# Patient Record
Sex: Female | Born: 1980 | Race: Black or African American | Hispanic: No | Marital: Single | Smoking: Never smoker
Health system: Southern US, Community
[De-identification: ages and names within clinical notes are randomized; demographics above are authoritative.]

---
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
## 2003-09-30 ENCOUNTER — Emergency Department (HOSPITAL_COMMUNITY): Admission: EM | Admit: 2003-09-30 | Discharge: 2003-10-01 | Payer: Self-pay | Admitting: Emergency Medicine

## 2003-10-25 ENCOUNTER — Other Ambulatory Visit: Admission: RE | Admit: 2003-10-25 | Discharge: 2003-10-25 | Payer: Self-pay | Admitting: Obstetrics and Gynecology

## 2004-01-22 ENCOUNTER — Emergency Department (HOSPITAL_COMMUNITY): Admission: EM | Admit: 2004-01-22 | Discharge: 2004-01-22 | Payer: Self-pay | Admitting: Emergency Medicine

## 2004-01-25 ENCOUNTER — Ambulatory Visit (HOSPITAL_COMMUNITY): Admission: RE | Admit: 2004-01-25 | Discharge: 2004-01-25 | Payer: Self-pay | Admitting: Obstetrics & Gynecology

## 2004-02-29 ENCOUNTER — Ambulatory Visit (HOSPITAL_COMMUNITY): Admission: RE | Admit: 2004-02-29 | Discharge: 2004-02-29 | Payer: Self-pay | Admitting: Obstetrics & Gynecology

## 2004-03-06 ENCOUNTER — Inpatient Hospital Stay (HOSPITAL_COMMUNITY): Admission: AD | Admit: 2004-03-06 | Discharge: 2004-03-08 | Payer: Self-pay | Admitting: *Deleted

## 2004-03-06 ENCOUNTER — Encounter (INDEPENDENT_AMBULATORY_CARE_PROVIDER_SITE_OTHER): Payer: Self-pay | Admitting: *Deleted

## 2004-03-06 ENCOUNTER — Ambulatory Visit: Payer: Self-pay | Admitting: Neonatology

## 2005-07-08 ENCOUNTER — Inpatient Hospital Stay (HOSPITAL_COMMUNITY): Admission: AD | Admit: 2005-07-08 | Discharge: 2005-07-08 | Payer: Self-pay | Admitting: Obstetrics & Gynecology

## 2005-09-15 ENCOUNTER — Inpatient Hospital Stay (HOSPITAL_COMMUNITY): Admission: AD | Admit: 2005-09-15 | Discharge: 2005-09-15 | Payer: Self-pay | Admitting: Obstetrics & Gynecology

## 2005-09-18 ENCOUNTER — Inpatient Hospital Stay (HOSPITAL_COMMUNITY): Admission: AD | Admit: 2005-09-18 | Discharge: 2005-09-20 | Payer: Self-pay | Admitting: Obstetrics & Gynecology

## 2006-12-30 IMAGING — US US OB COMP +14 WK
1 series · 13 of 28 positions shown · non-contrast
Comparison: none

CLINICAL DATA: 30 week 1 day assigned gestational age. Pelvic pressure and possible preterm labor.  Question shortened cervix on physical exam.  Evaluate growth, estimated fetal weight, and cervix.

[Series 1: us ob comp +14 wk · 0.37mm/px · 13 of 44 slices shown]
[im 2/44]
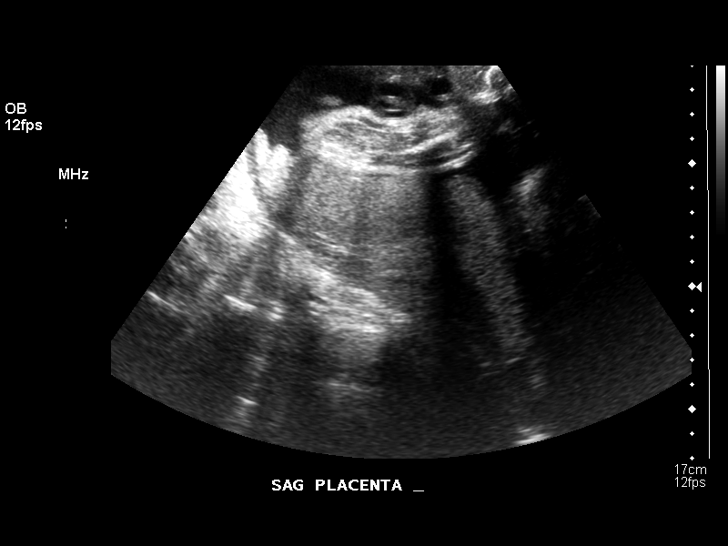
[im 5/44]
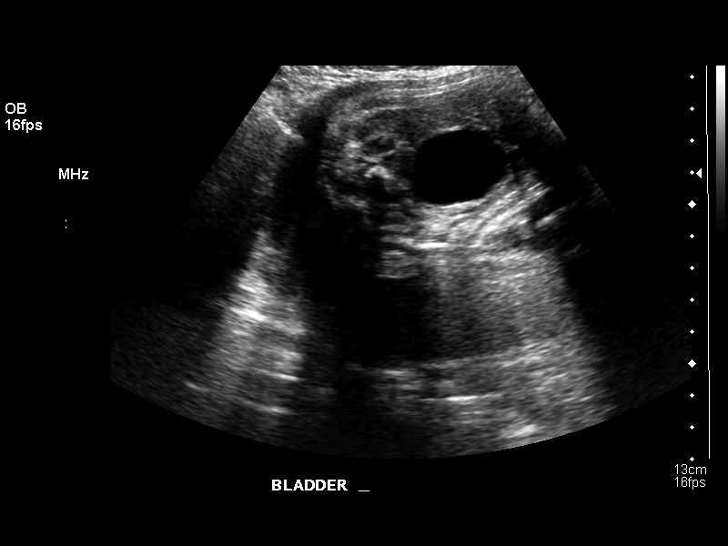
[im 8/44]
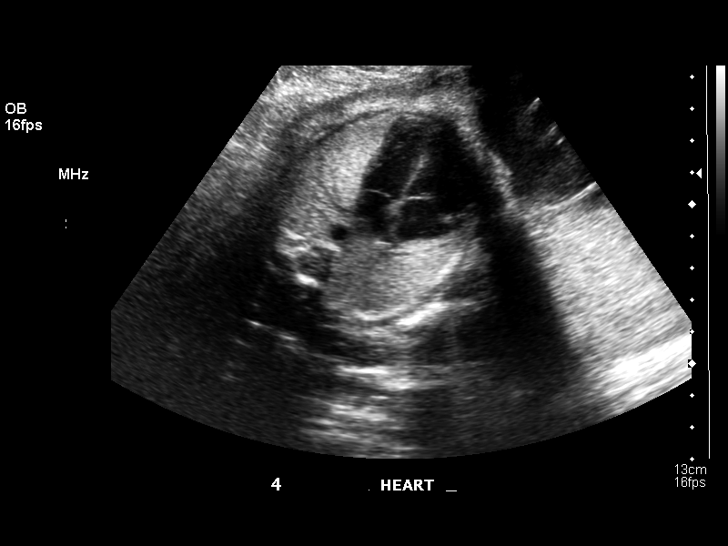
[im 12/44]
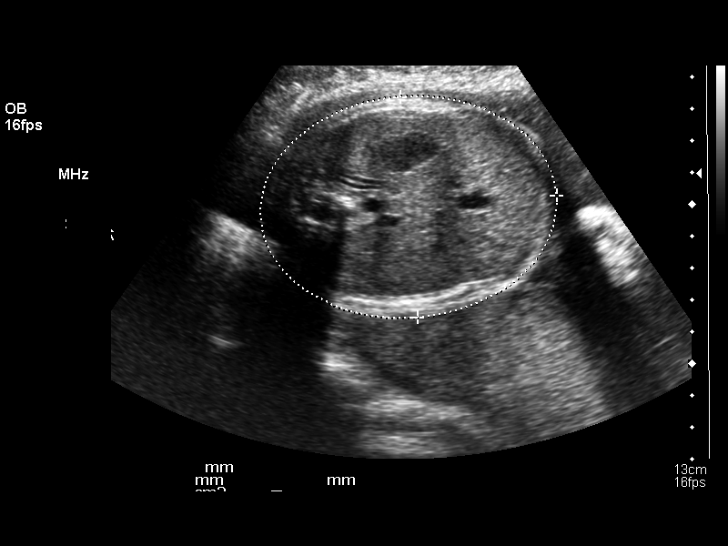
[im 15/44]
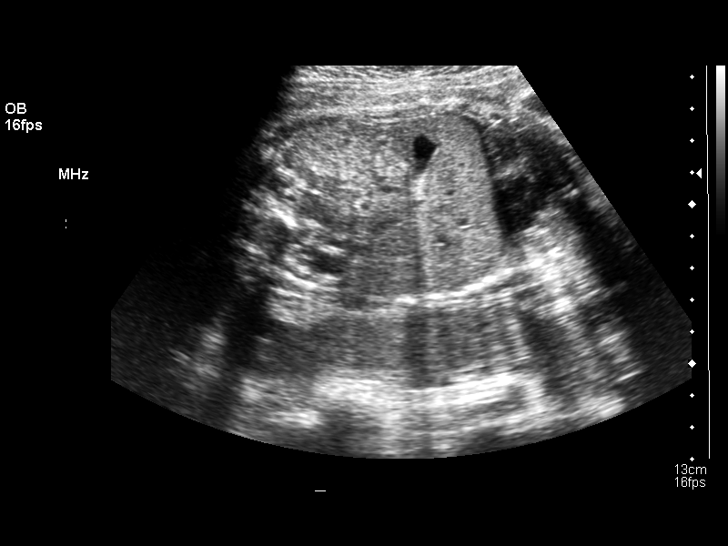
[im 18/44]
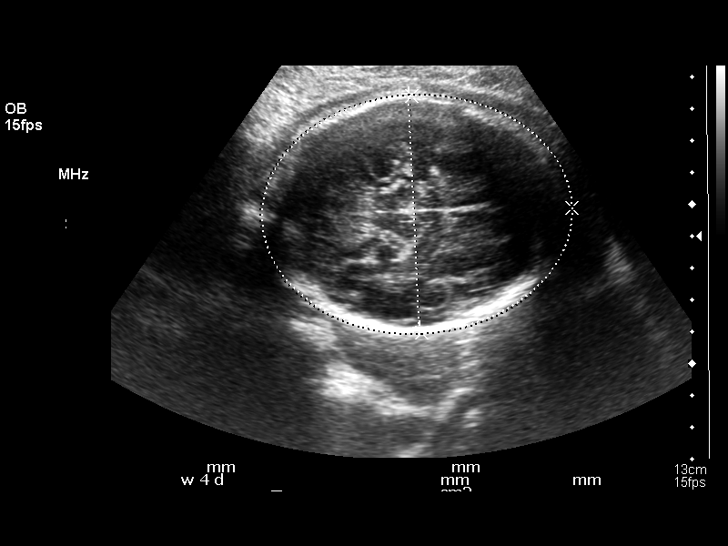
[im 23/44]
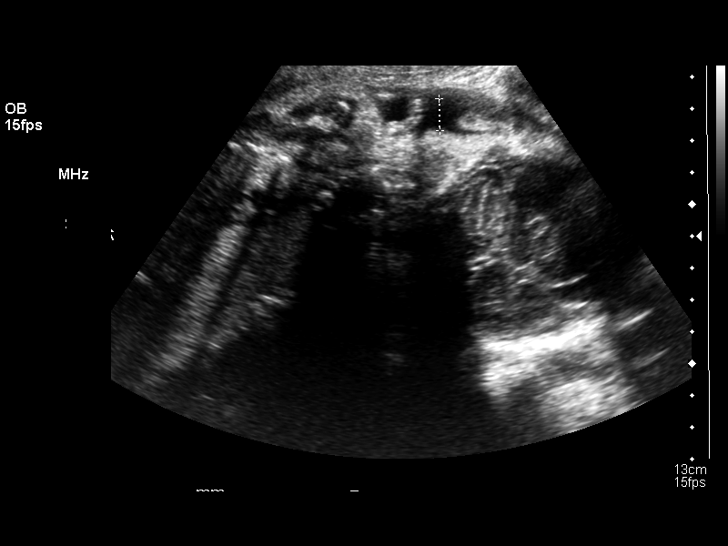
[im 26/44]
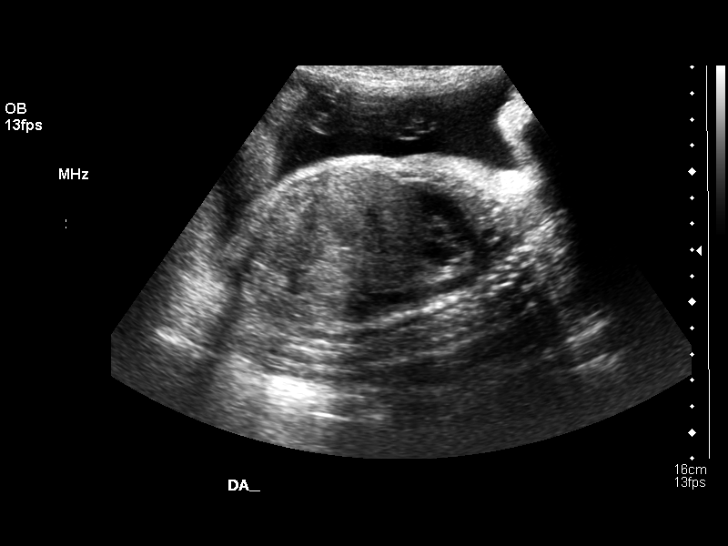
[im 29/44]
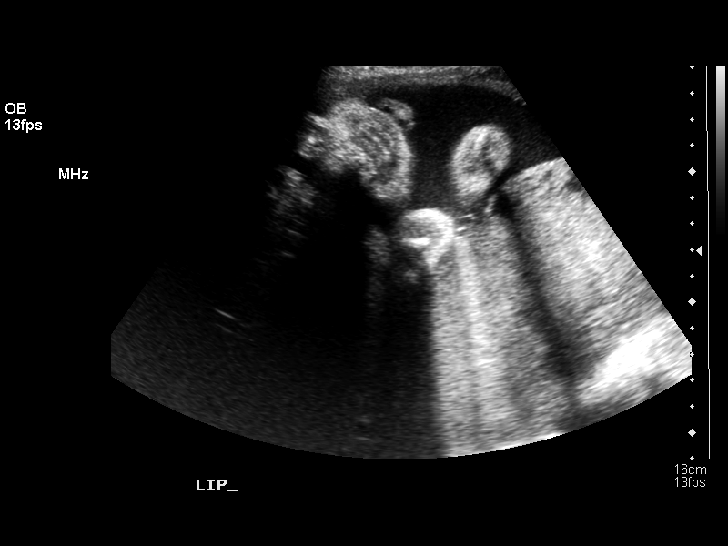
[im 32/44]
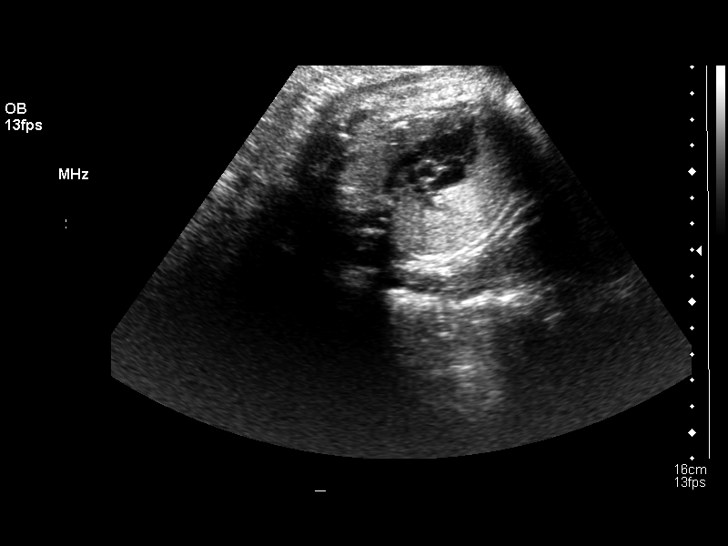
[im 36/44]
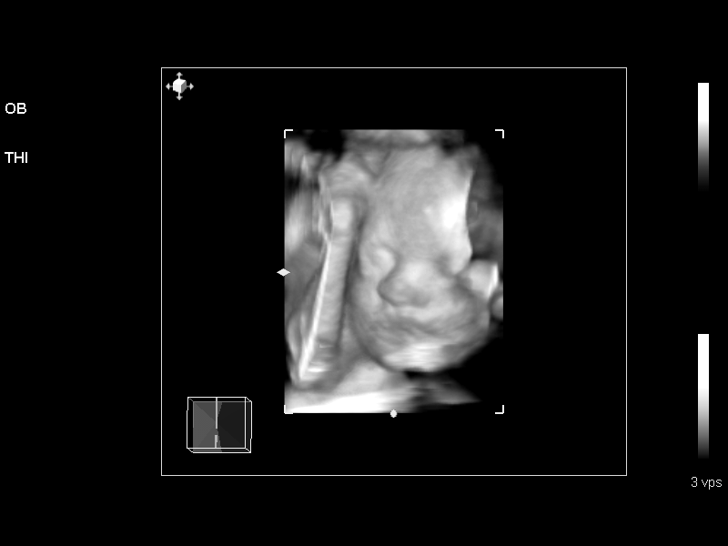
[im 39/44]
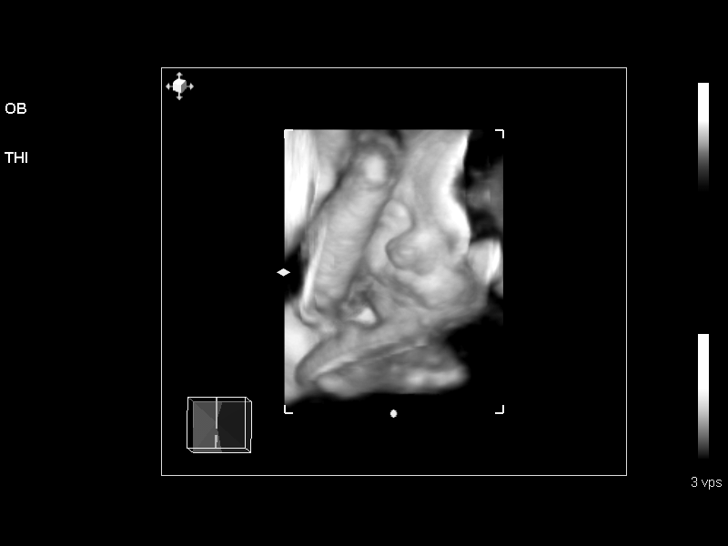
[im 42/44]
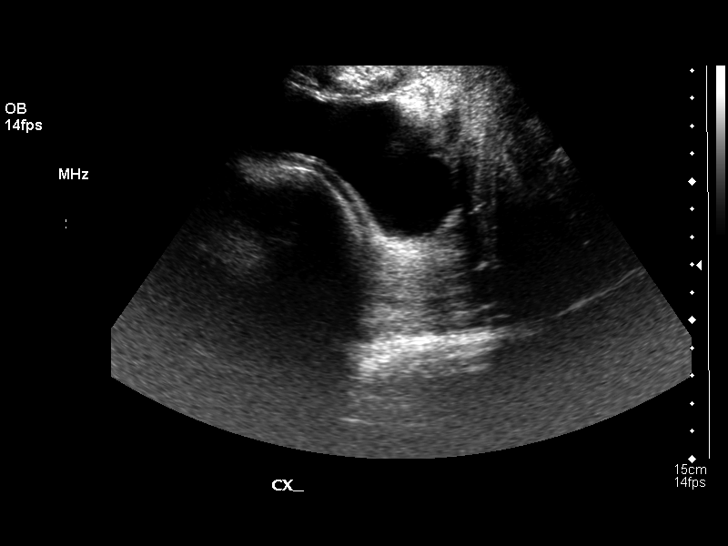

[13 of 28 positions shown; findings below may reference images not displayed]

OBSTETRICAL ULTRASOUND:
 Number of Fetuses: 1
 Heart Rate:  144
 Movement:  Yes
 Breathing:  No  
 Presentation:  Cephalic
 Placental Location:  Posterior
 Grade:  I
 Previa:  No
 Amniotic Fluid (Subjective):  Normal
 Amniotic Fluid (Objective):   13.3 cm AFI (5th -95th%ile = 9.0 ? 23.4 cm for 30 wks)

 FETAL BIOMETRY
 BPD:   7.4 cm   29 w 6 d
 HC:   27.1 cm   29 w 4 d
 AC:   25.5 cm   29 w 5 d
 FL:   5.8 cm  30 w 2 d

 MEAN GA:  29 w 6 d  US EDC:  09/17/05
 Assigned GA:  30 w 1 d  Assigned EDC:  09/15/05
 EFW:  1303 g (H) 50th ? 75th%ile (8985 ? 7777 g) for 30 wks

 FETAL ANATOMY
 Lateral Ventricles:    Visualized 
 Thalami/CSP:      Visualized 
 Posterior Fossa:  Visualized 
 Nuchal Region:    N/A
 Spine:      Not visualized   
 4 Chamber Heart on Left:      Visualized 
 Stomach on Left:      Visualized 
 3 Vessel Cord:    Visualized 
 Cord Insertion site:    Visualized 
 Kidneys:  Visualized 
 Bladder:  Visualized 
 Extremities:      Not visualized 

 ADDITIONAL ANATOMY VISUALIZED:  LVOT, RVOT, upper lip, orbits, profile, diaphragm, and ductal arch.
 Evaluation limited by:  Advanced gestational age and fetal position.

 MATERNAL UTERINE AND ADNEXAL FINDINGS
 Cervix: 4.2 cm Translabially
IMPRESSION: 1.  Assigned gestational age is currently 30 weeks 1 day.  Appropriate fetal growth, with EFW at 50th ? 75th percentile.
 2.  Normal amniotic fluid volume.  Normal cervical length measuring 4.2 cm.

## 2009-05-31 ENCOUNTER — Emergency Department (HOSPITAL_COMMUNITY): Admission: EM | Admit: 2009-05-31 | Discharge: 2009-05-31 | Payer: Self-pay | Admitting: Emergency Medicine

## 2010-08-08 NOTE — H&P (Signed)
Mackenzie Giles, Mackenzie Giles             ACCOUNT NO.:  [id number]   MEDICAL RECORD NO.:  [id number]          PATIENT TYPE:  INP   LOCATION:  9173                          FACILITY:  [facility]   PHYSICIAN:  Roseanna Rainbow, M.D.DATE OF BIRTH:  [birth year]   DATE OF ADMISSION:  09/18/2005  DATE OF DISCHARGE:                                HISTORY & PHYSICAL   CHIEF COMPLAINT:  The patient is a 30 year old, P2 with an estimated date of  confinement of June 26, with an intrauterine pregnancy of 40+ weeks with a  favorable Bishop score for induction of labor.   HISTORY OF PRESENT ILLNESS:  Please see the above.   ALLERGIES:  No known drug allergies.   MEDICATIONS:  None.   RISK FACTORS:  History of a preterm delivery.   LABORATORY DATA AND X-RAY FINDINGS:  Hemoglobin 10.1, hematocrit 31,  platelet count 204,000.  Chlamydia negative. GC negative.  Urine culture and  sensitivity no growth.  GBS negative on May 26.  One-hour GTT 84.  Blood  type B positive, antibody screen negative.  Hepatitis B surface antigen is  negative.  HIV is nonreactive.  RPR nonreactive.  Rubella immune.   SOCIAL HISTORY:  She denies any tobacco, ethanol or drug use.   PAST MEDICAL HISTORY:  Denies history.   PAST SURGICAL HISTORY:  Denies history.   PAST GYNECOLOGICAL HISTORY:  Noncontributory.   PAST OBSTETRICAL HISTORY:  In 2002, delivery of a live born female, full-  term, 6 pounds 11 ounces by vaginal delivery.  She also has a history of a  twin delivery complicated by preterm labor in 2005.   PHYSICAL EXAMINATION:  VITAL SIGNS:  Stable.  Afebrile.  Fetal heart tracing  reassuring.  Tocometer with regular uterine contractions.  PELVIC:  Sterile vaginal exam with cervix 4 cm dilated, 70-80% effaced with  vertex at a -2 station.  Membranes were artificially ruptured and clear  fluid was noted.   ASSESSMENT:  Multipara with an intrauterine pregnancy at term, now in early  latent labor.   PLAN:  Admission  to continue induction, augmentation of labor.      Roseanna Rainbow, M.D.  Electronically Signed     LAJ/MEDQ  D:  09/18/2005  T:  09/18/2005  Job:  [id number]

## 2010-08-08 NOTE — Discharge Summary (Signed)
Mackenzie Giles, Mackenzie Giles             ACCOUNT NO.:  [id number]   MEDICAL RECORD NO.:  [id number]          PATIENT TYPE:  INP   LOCATION:  9303                          [facility]ACILITY:  [facility]   PHYSICIAN:  Charles A. Clearance Coots, M.D.DATE OF BIRTH:  [birth year]   DATE OF ADMISSION:  03/06/2004  DATE OF DISCHARGE:  03/08/2004                                 DISCHARGE SUMMARY   ADMITTING DIAGNOSIS:  Twin gestation at 30 weeks with preterm labor and  preterm premature rupture of membranes.   DISCHARGE DIAGNOSIS:  Twin gestation at 30 weeks with preterm labor and  preterm premature rupture of membranes status post vaginal delivery of twins  on March 06, 2004.  Baby A female delivered at 1918 hours with Apgars of 3  at one minute, 5 at five minutes and 7 at seven minutes, weight of 1428 g,  length of 38 cm.  Baby B female at 1920 hours, Apgars of 6 at one minute, 8  at five minutes, weight of 1250 g, length of 40 cm.  Mother discharged home  in good condition.  Infants remained in neonatal intensive care for  prematurity.   REASON FOR ADMISSION:  [age]-year-old black female para 1 with twin  gestation with estimated date of confinement of May 14, 2004 with  estimated gestational age of [redacted] weeks gestation presents complaining of  rupture of membranes and uterine contractions.  Patient noticed a gush of  fluid several hours prior to presentation.  She also complains of regular  uterine contractions.   PAST MEDICAL HISTORY:  1.  Surgery:  None.  2.  Illnesses:  None.   MEDICATIONS:  Prenatal vitamins.   ALLERGIES:  No known drug allergies.   SOCIAL HISTORY:  Single.  Denies alcohol, tobacco, or recreational drug use.   PAST OBSTETRICAL AND GYNECOLOGIC HISTORY:  In September 2002 she delivered a  female 6 pounds 11 ounces full term vaginally.  She has a history of  abnormal Pap smears however, repeat Pap smears were normal.  She also has a  history of positive Chlamydia.   ANTEPARTUM  COURSE:  Known twin gestation.  Most recent ultrasound on  February 29, 2004 demonstrating concordant growth with estimated fetal weight  percentiles in the 50-75th percentile with estimated fetal weights of 1300-  1400 grams for both.   PRENATAL SCREENS:  Hemoglobin was 10.6, hematocrit 32.  Blood type was B  positive, Rh antibody was negative.  Sickle cell screen negative.  RPR was  nonreactive.  Rubella immune.  Hepatitis B surface antigen negative.  HIV  nonreactive.  PPD negative.  Herpes titers are negative.   PHYSICAL EXAMINATION:  Well-nourished, well-developed female in no acute  distress.  Fetal heart rate tracings were reactive x2.  The tocodynamometer  revealed uterine contractions every 2-3 minutes.  Afebrile.  Vital signs  were stable.  Abdomen was gravid, nontender.  Pelvic exam per RN the cervix  is 2 cm dilated and 80% effaced.  Ultrasound was performed which revealed  baby A to be vertex and baby B to be breech.   ADMITTING LABORATORY VALUES:  Hemoglobin 10.1, hematocrit  30, white blood  cell count 10,000, platelets 195,000.  RPR was nonreactive.   HOSPITAL COURSE:  Patient was admitted and discussion was made with the  patient regarding the risks, complications and benefits of twin vaginal  delivery versus cesarean section and vaginal delivery was agreed to by the  patient with an understanding of the risks, complications and benefits  involved.  Patient progressed in labor to full dilatation and was taken to  the operating room for twin delivery at approximately 8 cm dilatation.  She  progressed to normal spontaneous vaginal delivery of twins, baby A vertex  and baby B breech, without complications.  Postpartum course was  uncomplicated and patient was discharged home on postpartum day #2 in good  condition.   DISCHARGE LABORATORY VALUES:  Hemoglobin 9.9, hematocrit 29, white blood  cell count 23,000, platelets 234,000.   DISCHARGE DISPOSITION:  Continue prenatal  vitamins.  Tylox and ibuprofen was  prescribed for pain.  Routine written obstetrical discharge instructions  were given per booklet.  Patient is to call the office for a followup  appointment in 6 weeks.     Char   CAH/MEDQ  D:  03/08/2004  T:  03/09/2004  Job:  [id number]

## 2010-08-08 NOTE — H&P (Signed)
NAMENIMRA, PUCCINELLI NO.:  [id number]   MEDICAL RECORD NO.:  [id number]          PATIENT TYPE:  INP   LOCATION:  9170                          [facility]ILITY:  [facility]   PHYSICIAN:  Roseanna Rainbow, M.D.DATE OF BIRTH:  08-17-1980   DATE OF ADMISSION:  03/06/2004  DATE OF DISCHARGE:                                HISTORY & PHYSICAL   CHIEF COMPLAINT:  The patient is a 30 year old para 1 with a twin gestation,  estimated date of confinement May 14, 2004, with an estimated  gestational age at 30-1/7 weeks, complaining of ruptured membranes and  uterine contractions.   HISTORY OF PRESENT ILLNESS:  See above.  The patient noticed a gush of fluid  several hours prior to presentation.  She also complains of regular uterine  contractions.   ANTEPARTUM COURSE PROBLEMS/RISKS:  Known twin gestation, the most recent  ultrasound on February 29, 2004; this demonstrated concordant growth with  estimated fetal weight percentiles in the 50th to 75th percentile.  Estimated fetal weights were approximately 1300-1400 g.   PRENATAL SCREENS:  Hemoglobin 10.6, hematocrit 32.2, platelets 252,000.  Blood type -- B-positive, Rh-antibody negative.  Sickle cell trait negative.  RPR nonreactive.  Rubella immune.  Hepatitis B surface antigen negative.  HIV nonreactive.  PPD negative.  HSV titers negative.   PAST OBSTETRICAL/GYNECOLOGICAL HISTORY:  In September 2002, she was  delivered of a female, 6 pounds 11 ounces, full-term, spontaneous vaginal  delivery.  She has a history of abnormal Pap smears, however, repeat tests  were normal.  She has a history of Chlamydia.   PAST MEDICAL HISTORY:  She denies.   PAST SURGICAL HISTORY:  She denies.   FAMILY HISTORY:  Noncontributory.   SOCIAL HISTORY:  She denies any alcohol, tobacco or substance abuse.   ALLERGIES:  No known drug allergies.   MEDICATIONS:  Prenatal vitamins.   PHYSICAL EXAM:  VITAL SIGNS:  Stable, afebrile.  Fetal  heart tracings  reactive x2.  Tocodynamometer with uterine contractions every 2-3 minutes.  GENERAL:  Well-developed, well-nourished, in no apparent distress.  ABDOMEN:  Abdomen gravid, nontender.  PELVIC:  Vaginal exam per R.N.: Two-centimeters dilated, 80% effaced.   IMAGING STUDIES:  Ultrasound:  Vertex, breech presentation.   ASSESSMENT:  Twin gestation at 30-1/7 weeks with preterm labor and preterm  premature rupture of membranes, fetal heart tracings consistent with fetal  well-being.   PLAN:  1.  Admission.  2.  Expectant management.  3.  Penicillin GBS prophylaxis.  4.  Steroids.     Collier Flowers  D:  03/06/2004  T:  03/06/2004  Job:  [id number]

## 2010-11-22 IMAGING — CT CT HEAD W/O CM
1 series · 16 of 30 positions shown, 20 images · non-contrast
Comparison: None

CLINICAL DATA: Progressive posterior headaches.

CT HEAD WITHOUT CONTRAST
TECHNIQUE: Contiguous axial images were obtained from the base of
the skull through the vertex without contrast

[Series 2: headseq 4.8 h45s · axial · 0.43mm/px · z∈[+945,+1076]mm · 16 of 30 slices shown, 20 images]
[im 2/30  brain]
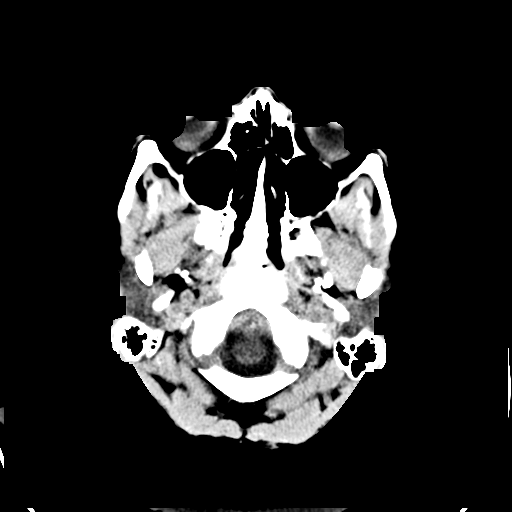
[im 2/30  bone]
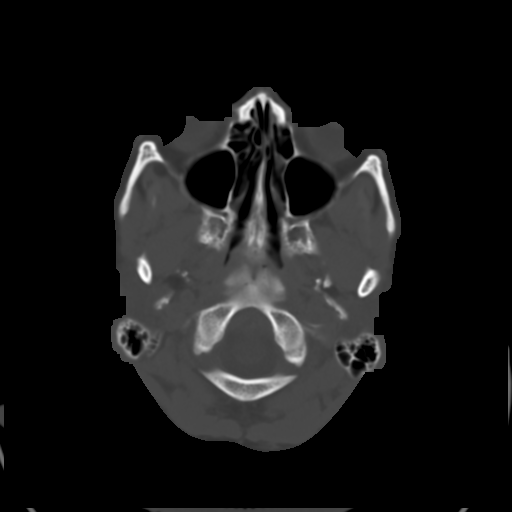
[im 4/30  brain]
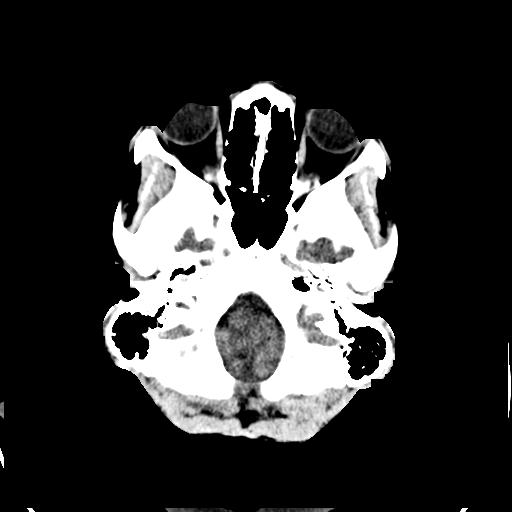
[im 6/30  brain]
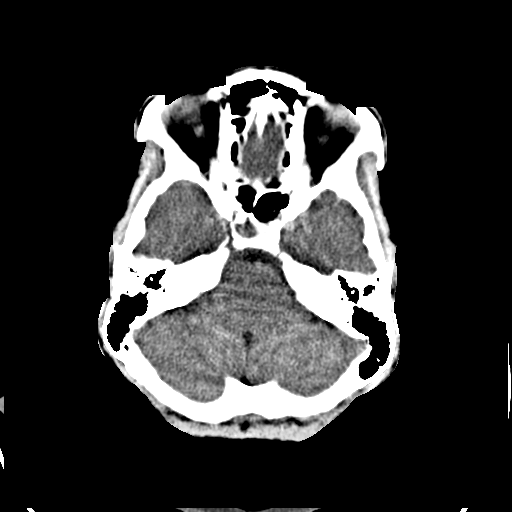
[im 8/30  brain]
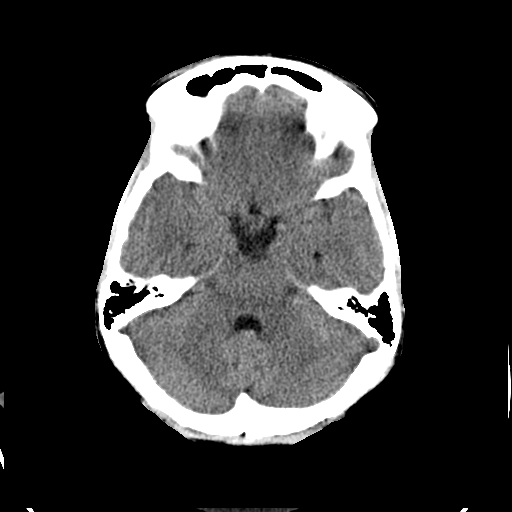
[im 9/30  brain]
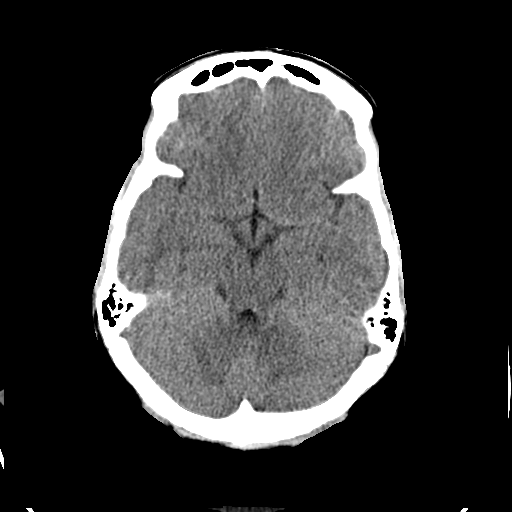
[im 9/30  bone]
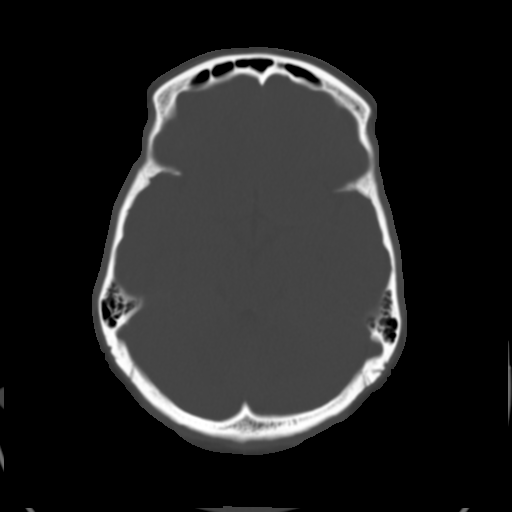
[im 11/30  brain]
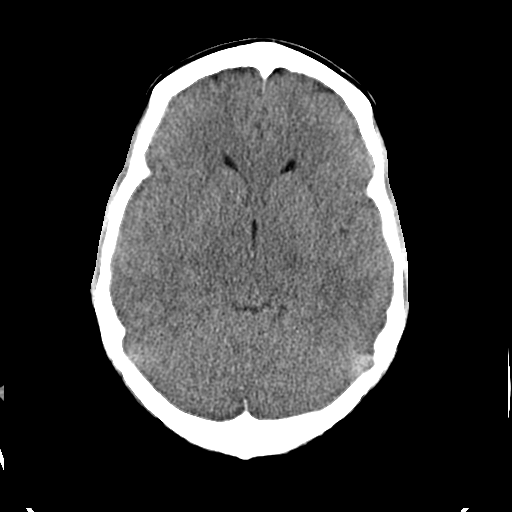
[im 13/30  brain]
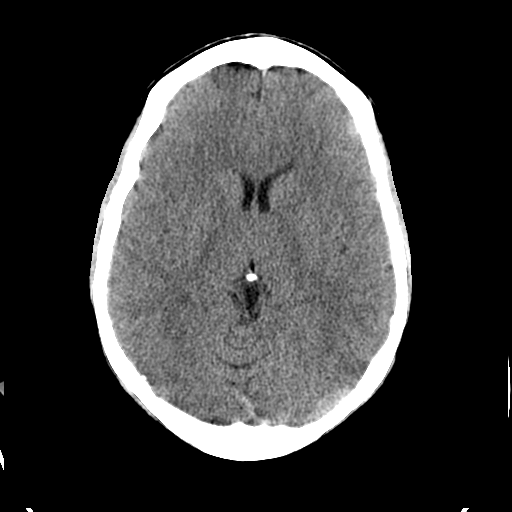
[im 15/30  brain]
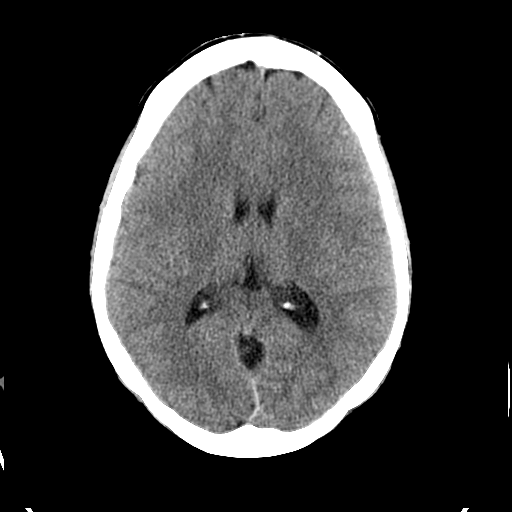
[im 16/30  brain]
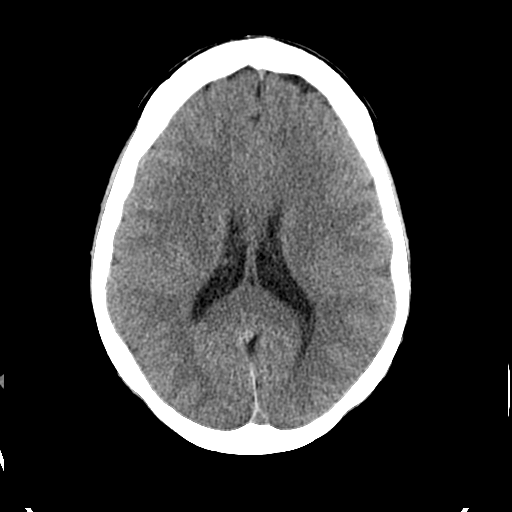
[im 16/30  bone]
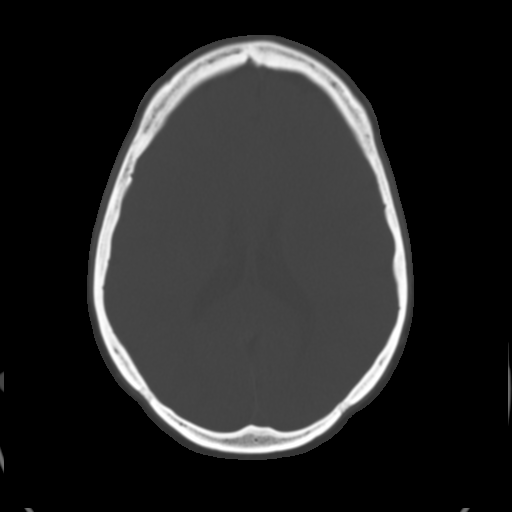
[im 18/30  brain]
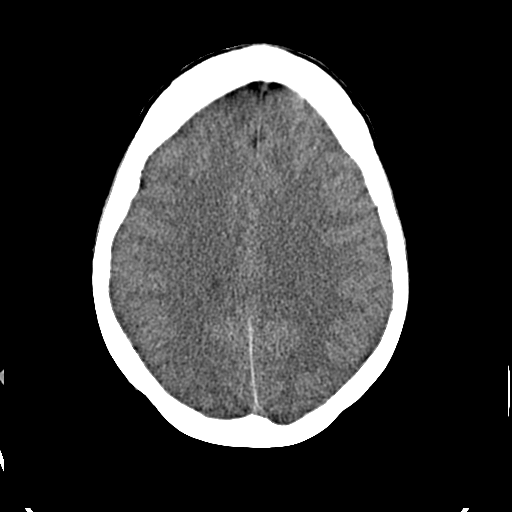
[im 20/30  brain]
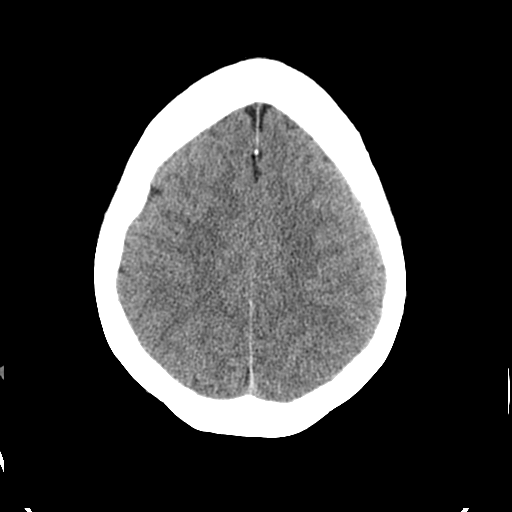
[im 22/30  brain]
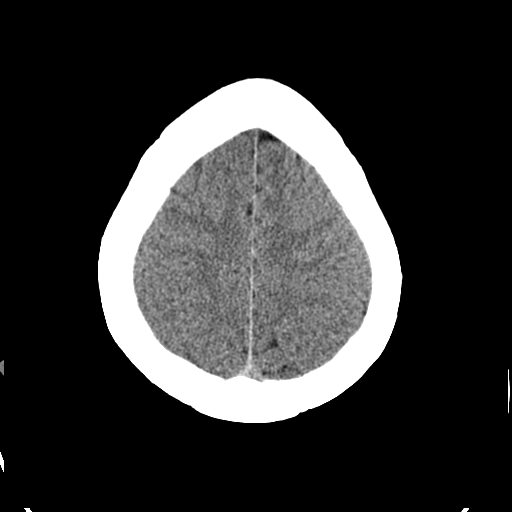
[im 23/30  brain]
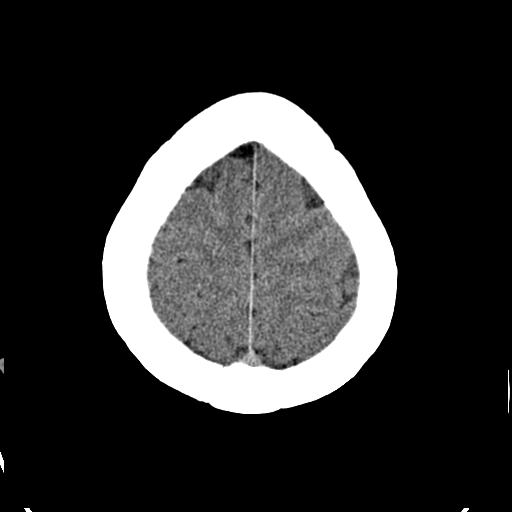
[im 23/30  bone]
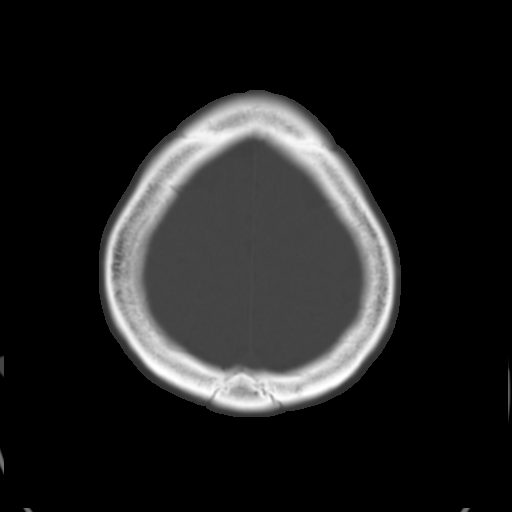
[im 25/30  brain]
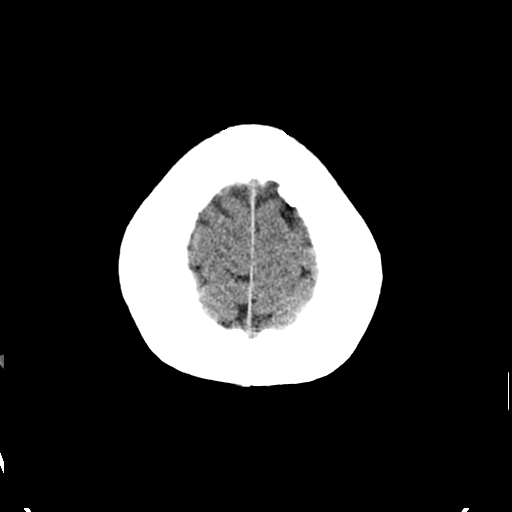
[im 27/30  brain]
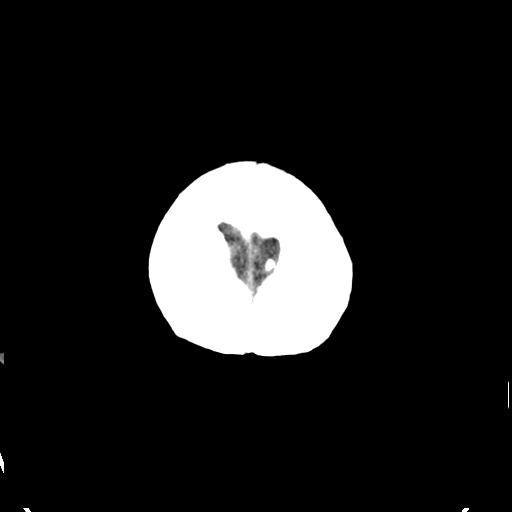
[im 29/30  brain]
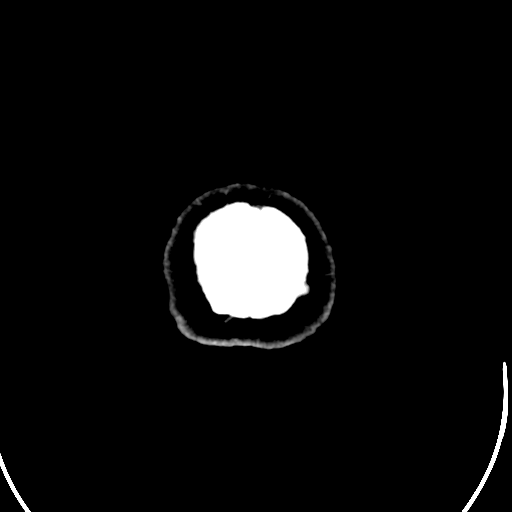

[16 of 30 positions shown; findings below may reference images not displayed]

FINDINGS: There is no evidence of intracranial hemorrhage, brain
edema, or other signs of acute infarction.  There is no evidence of
intracranial mass lesion or mass effect.  No abnormal extraaxial
fluid collections are identified.  There is no evidence of
hydrocephalus, or other significant intracranial abnormality.  No
skull abnormality identified.
IMPRESSION: Negative non-contrast head CT.

## 2013-11-21 ENCOUNTER — Encounter (HOSPITAL_COMMUNITY): Payer: Self-pay | Admitting: Emergency Medicine

## 2013-11-21 ENCOUNTER — Emergency Department (HOSPITAL_COMMUNITY)
Admission: EM | Admit: 2013-11-21 | Discharge: 2013-11-21 | Disposition: A | Discharge status: Home / Self Care | Payer: BC Managed Care – PPO | Source: Home / Self Care | Attending: Family Medicine | Admitting: Family Medicine

## 2013-11-21 DIAGNOSIS — N390 Urinary tract infection, site not specified: Secondary | ICD-10-CM

## 2013-11-21 LAB — POCT PREGNANCY, URINE: PREG TEST UR: NEGATIVE

## 2013-11-21 MED ORDER — CEPHALEXIN 500 MG PO CAPS: 500.0000 mg | ORAL_CAPSULE | Freq: Three times a day (TID) | ORAL | Status: DC

## 2013-11-21 NOTE — ED Notes (Signed)
C/o symptoms for 2 weeks: odor to urine, dry mouth, and cloudy urine.  Patient has increased fluid intake, cranberry juice and azo.  Denies back pain, denies abdominal pain.

## 2013-11-21 NOTE — Discharge Instructions (Signed)
Thank you for coming in today. If your belly pain worsens, or you have high fever, bad vomiting, blood in your stool or black tarry stool go to the Emergency Room.   Urinary Tract Infection Urinary tract infections (UTIs) can develop anywhere along your urinary tract. Your urinary tract is your body's drainage system for removing wastes and extra water. Your urinary tract includes two kidneys, two ureters, a bladder, and a urethra. Your kidneys are a pair of bean-shaped organs. Each kidney is about the size of your fist. They are located below your ribs, one on each side of your spine. CAUSES Infections are caused by microbes, which are microscopic organisms, including fungi, viruses, and bacteria. These organisms are so small that they can only be seen through a microscope. Bacteria are the microbes that most commonly cause UTIs. SYMPTOMS  Symptoms of UTIs may vary by age and gender of the patient and by the location of the infection. Symptoms in young women typically include a frequent and intense urge to urinate and a painful, burning feeling in the bladder or urethra during urination. Older women and men are more likely to be tired, shaky, and weak and have muscle aches and abdominal pain. A fever may mean the infection is in your kidneys. Other symptoms of a kidney infection include pain in your back or sides below the ribs, nausea, and vomiting. DIAGNOSIS To diagnose a UTI, your caregiver will ask you about your symptoms. Your caregiver also will ask to provide a urine sample. The urine sample will be tested for bacteria and white blood cells. White blood cells are made by your body to help fight infection. TREATMENT  Typically, UTIs can be treated with medication. Because most UTIs are caused by a bacterial infection, they usually can be treated with the use of antibiotics. The choice of antibiotic and length of treatment depend on your symptoms and the type of bacteria causing your  infection. HOME CARE INSTRUCTIONS  If you were prescribed antibiotics, take them exactly as your caregiver instructs you. Finish the medication even if you feel better after you have only taken some of the medication.  Drink enough water and fluids to keep your urine clear or pale yellow.  Avoid caffeine, tea, and carbonated beverages. They tend to irritate your bladder.  Empty your bladder often. Avoid holding urine for long periods of time.  Empty your bladder before and after sexual intercourse.  After a bowel movement, women should cleanse from front to back. Use each tissue only once. SEEK MEDICAL CARE IF:   You have back pain.  You develop a fever.  Your symptoms do not begin to resolve within 3 days. SEEK IMMEDIATE MEDICAL CARE IF:   You have severe back pain or lower abdominal pain.  You develop chills.  You have nausea or vomiting.  You have continued burning or discomfort with urination. MAKE SURE YOU:   Understand these instructions.  Will watch your condition.  Will get help right away if you are not doing well or get worse. Document Released: 12/17/2004 Document Revised: 09/08/2011 Document Reviewed: 04/17/2011 Exit[employer] Patient Information 2015 [employer]. This information is not intended to replace advice given to you by your health care provider. Make sure you discuss any questions you have with your health care provider.  

## 2013-11-21 NOTE — ED Provider Notes (Signed)
Mackenzie Giles is a 33 y.o. female who presents to Urgent Care today for urinary frequency and foul odor. Symptoms present for 2 weeks and consistent with prior episodes of UTI. Patient has tried cranberry juice and AZO which have not helped. No fevers or chills nausea vomiting or diarrhea. No abdominal pain or back pain. No significant vaginal discharge.   History reviewed. No pertinent past medical history. History  Substance Use Topics  . Smoking status: Never Smoker   . Smokeless tobacco: Not on file  . Alcohol Use: No   ROS as above Medications: No current facility-administered medications for this encounter.   Current Outpatient Prescriptions  Medication Sig Dispense Refill  . cephALEXin (KEFLEX) 500 MG capsule Take 1 capsule (500 mg total) by mouth 3 (three) times daily.  21 capsule  0    Exam:  BP 126/78  Pulse 86  Temp(Src) 98.6 F (37 C) (Oral)  Resp 16  SpO2 100%  LMP 11/21/2013 Gen: Well NAD HEENT: EOMI,  MMM Lungs: Normal work of breathing. CTABL Heart: RRR no MRG Abd: NABS, Soft. Nondistended, Nontender no CV angle tenderness to percussion Exts: Brisk capillary refill, warm and well perfused.   Urinalysis significant for positive trace blood, positive nitrates and trace leukocyte Estrace.  Results for orders placed during the hospital encounter of 11/21/13 (from the past 24 hour(s))  POCT PREGNANCY, URINE     Status: None   Collection Time    11/21/13  4:41 PM      Result Value Ref Range   Preg Test, Ur NEGATIVE  NEGATIVE   No results found.  Assessment and Plan: 33 y.o. female with urinary tract infection. Treatment with Keflex. Followup as needed.  Discussed warning signs or symptoms. Please see discharge instructions. Patient expresses understanding.   This note was created using Conservation officer, historic buildings. Any transcription errors are unintended.    Rodolph Bong, MD 11/21/13 [phone]

## 2013-11-22 LAB — POCT URINALYSIS DIP (DEVICE)
Bilirubin Urine: NEGATIVE
Glucose, UA: NEGATIVE mg/dL
Ketones, ur: NEGATIVE mg/dL
Nitrite: POSITIVE — AB
Protein, ur: NEGATIVE mg/dL
Specific Gravity, Urine: 1.015 (ref 1.005–1.030)
Urobilinogen, UA: 2 mg/dL — ABNORMAL HIGH (ref 0.0–1.0)
pH: 7 (ref 5.0–8.0)

## 2014-01-18 ENCOUNTER — Ambulatory Visit (INDEPENDENT_AMBULATORY_CARE_PROVIDER_SITE_OTHER): Payer: BC Managed Care – PPO | Admitting: Family Medicine

## 2014-01-18 VITALS — BP 124/86 | HR 64 | Temp 98.7°F | Resp 16 | Ht 62.0 in | Wt 138.0 lb

## 2014-01-18 DIAGNOSIS — Z111 Encounter for screening for respiratory tuberculosis: Secondary | ICD-10-CM

## 2014-01-18 DIAGNOSIS — Z021 Encounter for pre-employment examination: Secondary | ICD-10-CM

## 2014-01-18 NOTE — Patient Instructions (Signed)
We will give you a copy of your TB test result when you come in to have it read.  Turn this in along with your form

## 2014-01-18 NOTE — Progress Notes (Signed)
[facility] [address], [place] [address] [phone]  Date:  01/18/2014   Name:  Mackenzie Giles   DOB:  February 10, 1981   MRN:  [id number]  PCP:  No PCP Per Patient    Chief Complaint: Annual Exam   History of Present Illness:  Mackenzie Hiddenralicia L Barretto is a 33 y.o. 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   She is generally in good health.  She is on Depo-provera and does have a regular doctor.  She declines other labs or pap today and also declines a flu shot.  She has had TB testing in the past but is not sure if this ir required for her current job.  Did questionnaire as below but as she had positive answers did place PPB   Tuberculosis Risk Questionnaire  1. No Were you born outside the [place] in one of the following parts of the world: [place], [place], [place], [place] or [place]?    2. No Have you traveled outside the [place] and lived for more than one month in one of the following parts of the world: [place], [place], [place], [place] or [place]?    3. No Do you have a compromised immune system such as from any of the following conditions:HIV/AIDS, organ or bone marrow transplantation, diabetes, immunosuppressive medicines (e.g. Prednisone, Remicaide), leukemia, lymphoma, cancer of the head or neck, gastrectomy or jejunal bypass, end-stage renal disease (on dialysis), or silicosis?     4. Yes  Have you ever or do you plan on working in: a residential care center, a health care facility, a jail or prison or homeless shelter?    5. No Have you ever: injected illegal drugs, used crack cocaine, lived in a homeless shelter  or been in jail or prison?     6. No Have you ever been exposed to anyone with infectious tuberculosis?    Tuberculosis Symptom Questionnaire  Do you currently  have any of the following symptoms?  1. No Unexplained cough lasting more than 3 weeks?   2. No Unexplained fever lasting more than 3 weeks.   3. Yes Night Sweats (sweating that leaves the bedclothes and sheets wet)     4. No Shortness of Breath   5. No Chest Pain   6. No Unintentional weight loss    7. No Unexplained fatigue (very tired for no reason)      There are no active problems to display for this patient.   History reviewed. No pertinent past medical history.  History reviewed. No pertinent past surgical history.  History  Substance Use Topics  . Smoking status: Never Smoker   . Smokeless tobacco: Not on file  . Alcohol Use: No    Family History  Problem Relation Age of Onset  . Cancer Mother     breast cancer    No Known Allergies  Medication list has been reviewed and updated.  Current Outpatient Prescriptions on File Prior to Visit  Medication Sig Dispense Refill  . cephALEXin (KEFLEX) 500 MG capsule Take 1 capsule (500 mg total) by mouth 3 (three) times daily.  21 capsule  0   No current facility-administered medications on file prior to visit.    Review of Systems:  As per HPI- otherwise negative.   Physical Examination:  Filed Vitals:   01/18/14 1602  BP: 124/86  Pulse: 64  Temp: 98.7 F (37.1 C)  Resp: 16   Filed Vitals:   01/18/14 1602  Height: 5\' 2"  (1.575 m)  Weight: 138 lb (62.596 kg)   Body mass index is 25.23 kg/(m^2). Ideal Body Weight: Weight in (lb) to have BMI = 25: 136.4  GEN: WDWN, NAD, Non-toxic, A & O x 3, looks well HEENT: Atraumatic, Normocephalic. Neck supple. No masses, No LAD. Ears and Nose: No external deformity. CV: RRR, No M/G/R. No JVD. No thrill. No extra heart sounds. PULM: CTA B, no wheezes, crackles, rhonchi. No retractions. No resp. distress. No accessory muscle use. ABD: S, NT, ND. No rebound. No HSM. EXTR: No c/c/e NEURO Normal gait.  PSYCH: Normally interactive. Conversant. Not depressed or  anxious appearing.  Calm demeanor.    Assessment and Plan: Physical exam, pre-employment - Plan: TB Skin Test  As she did have 2 yes answers on TB questionnaire place PPD.  Signed her from but placed addendum that she has a PPD pending which needs to be read, she will turn in this result as well  Signed Abbe AmsterdamJessica Dewon Mendizabal, MD

## 2014-01-21 ENCOUNTER — Ambulatory Visit (INDEPENDENT_AMBULATORY_CARE_PROVIDER_SITE_OTHER): Payer: BC Managed Care – PPO | Admitting: *Deleted

## 2014-01-21 DIAGNOSIS — Z111 Encounter for screening for respiratory tuberculosis: Secondary | ICD-10-CM

## 2014-01-21 DIAGNOSIS — Z7689 Persons encountering health services in other specified circumstances: Secondary | ICD-10-CM

## 2014-01-21 LAB — TB SKIN TEST
Induration: NORMAL mm
TB Skin Test: NEGATIVE

## 2014-06-06 ENCOUNTER — Encounter (HOSPITAL_BASED_OUTPATIENT_CLINIC_OR_DEPARTMENT_OTHER): Payer: Self-pay | Admitting: *Deleted

## 2014-06-06 ENCOUNTER — Emergency Department (HOSPITAL_BASED_OUTPATIENT_CLINIC_OR_DEPARTMENT_OTHER)
Admission: EM | Admit: 2014-06-06 | Discharge: 2014-06-06 | Disposition: A | Discharge status: Home / Self Care | Payer: Medicaid Other | Attending: Emergency Medicine | Admitting: Emergency Medicine

## 2014-06-06 ENCOUNTER — Encounter (HOSPITAL_COMMUNITY): Payer: Self-pay | Admitting: *Deleted

## 2014-06-06 ENCOUNTER — Emergency Department (HOSPITAL_COMMUNITY)
Admission: EM | Admit: 2014-06-06 | Discharge: 2014-06-06 | Discharge status: Against Medical Advice | Payer: Medicaid Other | Source: Home / Self Care

## 2014-06-06 DIAGNOSIS — K088 Other specified disorders of teeth and supporting structures: Secondary | ICD-10-CM | POA: Insufficient documentation

## 2014-06-06 DIAGNOSIS — K029 Dental caries, unspecified: Secondary | ICD-10-CM | POA: Insufficient documentation

## 2014-06-06 MED ORDER — PROMETHAZINE HCL 25 MG PO TABS: 25.0000 mg | ORAL_TABLET | Freq: Four times a day (QID) | ORAL | Status: DC | PRN

## 2014-06-06 MED ORDER — PENICILLIN V POTASSIUM 500 MG PO TABS: 500.0000 mg | ORAL_TABLET | Freq: Four times a day (QID) | ORAL | Status: AC

## 2014-06-06 MED ORDER — TRAMADOL HCL 50 MG PO TABS: 50.0000 mg | ORAL_TABLET | Freq: Four times a day (QID) | ORAL | Status: DC | PRN

## 2014-06-06 MED ORDER — IBUPROFEN 800 MG PO TABS: 800.0000 mg | ORAL_TABLET | Freq: Three times a day (TID) | ORAL | Status: DC | PRN

## 2014-06-06 NOTE — ED Notes (Signed)
Pt reports L upper dental pain since Monday.  Called her dentist and they can't see her until next Thursday.

## 2014-06-06 NOTE — ED Notes (Signed)
No answer in lobby x 3; pt eloped after triage

## 2014-06-06 NOTE — ED Notes (Signed)
Pt amb to room 7 with quick steady gait in nad. Pt reports left upper tooth pain x Monday. Soonest she could get apt with dentist is next Thursday.

## 2014-06-06 NOTE — ED Provider Notes (Signed)
TIME SEEN: 10:50 AM  CHIEF COMPLAINT: Dental pain  HPI: Pt is a 34 y.o. female with a past medical history who presents emergency department with left dental pain after 2 days ago. No history of injury. No fever, swelling underneath the jaw, trismus or drooling, drainage from her gums. Has a dental appointment in 8 days.  ROS: See HPI Constitutional: no fever  Eyes: no drainage  ENT: no runny nose   Cardiovascular:  no chest pain  Resp: no SOB  GI: no vomiting GU: no dysuria Integumentary: no rash  Allergy: no hives  Musculoskeletal: no leg swelling  Neurological: no slurred speech ROS otherwise negative  PAST MEDICAL HISTORY/PAST SURGICAL HISTORY:  History reviewed. No pertinent past medical history.  MEDICATIONS:  Prior to Admission medications   Not on File    ALLERGIES:  No Known Allergies  SOCIAL HISTORY:  History  Substance Use Topics  . Smoking status: Never Smoker   . Smokeless tobacco: Not on file  . Alcohol Use: No    FAMILY HISTORY: Family History  Problem Relation Age of Onset  . Cancer Mother     breast cancer    EXAM: BP 138/81 mmHg  Pulse 64  Temp(Src) 98.6 F (37 C) (Oral)  Resp 16  Ht 5\' 1"  (1.549 m)  Wt 142 lb (64.411 kg)  BMI 26.84 kg/m2  SpO2 100% CONSTITUTIONAL: Alert and oriented and responds appropriately to questions. Well-appearing; well-nourished HEAD: Normocephalic EYES: Conjunctivae clear, PERRL ENT: normal nose; no rhinorrhea; moist mucous membranes; pharynx without lesions noted, no trismus or drooling, no tonsillar hypertrophy or exudate, no uvular deviation, normal phonation, multiple dental caries without sign of obvious dental abscess, no drainage, no sign of Ludwig angina, tongue sits flat in the bottom of the mouth NECK: Supple, no meningismus, no LAD  CARD: RRR; S1 and S2 appreciated; no murmurs, no clicks, no rubs, no gallops RESP: Normal chest excursion without splinting or tachypnea; breath sounds clear and equal  bilaterally; no wheezes, no rhonchi, no rales,  ABD/GI: Normal bowel sounds; non-distended; soft, non-tender, no rebound, no guarding BACK:  The back appears normal and is non-tender to palpation, there is no CVA tenderness EXT: Normal ROM in all joints; non-tender to palpation; no edema; normal capillary refill; no cyanosis    SKIN: Normal color for age and race; warm NEURO: Moves all extremities equally PSYCH: The patient's mood and manner are appropriate. Grooming and personal hygiene are appropriate.  MEDICAL DECISION MAKING: Patient here with dental pain from dental caries. No sign of abscess or Ludwig angina. We'll discharge home on penicillin, tramadol and ibuprofen. She has dental follow-up in 8 days. Discussed return precautions.      Layla MawKristen N Jerita Wimbush, DO 06/06/14 1118

## 2014-06-06 NOTE — Discharge Instructions (Signed)
Dental Care and Dentist Visits °Dental care supports good overall health. Regular dental visits can also help you avoid dental pain, bleeding, infection, and other more serious health problems in the future. It is important to keep the mouth healthy because diseases in the teeth, gums, and other oral tissues can spread to other areas of the body. Some problems, such as diabetes, heart disease, and pre-term labor have been associated with poor oral health.  °See your dentist every 6 months. If you experience emergency problems such as a toothache or broken tooth, go to the dentist right away. If you see your dentist regularly, you may catch problems early. It is easier to be treated for problems in the early stages.  °WHAT TO EXPECT AT A DENTIST VISIT  °Your dentist will look for many common oral health problems and recommend proper treatment. At your regular dental visit, you can expect: °· Gentle cleaning of the teeth and gums. This includes scraping and polishing. This helps to remove the sticky substance around the teeth and gums (plaque). Plaque forms in the mouth shortly after eating. Over time, plaque hardens on the teeth as tartar. If tartar is not removed regularly, it can cause problems. Cleaning also helps remove stains. °· Periodic X-rays. These pictures of the teeth and supporting bone will help your dentist assess the health of your teeth. °· Periodic fluoride treatments. Fluoride is a natural mineral shown to help strengthen teeth. Fluoride treatment involves applying a fluoride gel or varnish to the teeth. It is most commonly done in children. °· Examination of the mouth, tongue, jaws, teeth, and gums to look for any oral health problems, such as: °· Cavities (dental caries). This is decay on the tooth caused by plaque, sugar, and acid in the mouth. It is best to catch a cavity when it is small. °· Inflammation of the gums caused by plaque buildup (gingivitis). °· Problems with the mouth or malformed  or misaligned teeth. °· Oral cancer or other diseases of the soft tissues or jaws.  °KEEP YOUR TEETH AND GUMS HEALTHY °For healthy teeth and gums, follow these general guidelines as well as your dentist's specific advice: °· Have your teeth professionally cleaned at the dentist every 6 months. °· Brush twice daily with a fluoride toothpaste. °· Floss your teeth daily.  °· Ask your dentist if you need fluoride supplements, treatments, or fluoride toothpaste. °· Eat a healthy diet. Reduce foods and drinks with added sugar. °· Avoid smoking. °TREATMENT FOR ORAL HEALTH PROBLEMS °If you have oral health problems, treatment varies depending on the conditions present in your teeth and gums. °· Your caregiver will most likely recommend good oral hygiene at each visit. °· For cavities, gingivitis, or other oral health disease, your caregiver will perform a procedure to treat the problem. This is typically done at a separate appointment. Sometimes your caregiver will refer you to another dental specialist for specific tooth problems or for surgery. °SEEK IMMEDIATE DENTAL CARE IF: °· You have pain, bleeding, or soreness in the gum, tooth, jaw, or mouth area. °· A permanent tooth becomes loose or separated from the gum socket. °· You experience a blow or injury to the mouth or jaw area. °Document Released: 11/19/2010 Document Revised: 06/01/2011 Document Reviewed: 11/19/2010 °ExitCare® Patient Information ©2015 [employer]. This information is not intended to replace advice given to you by your health care provider. Make sure you discuss any questions you have with your health care provider. °Dental Caries °Dental caries (also called tooth decay) is   the most common oral disease. It can occur at any age but is more common in children and young adults.  °HOW DENTAL CARIES DEVELOPS  °The process of decay begins when bacteria and foods (particularly sugars and starches) combine in your mouth to produce plaque. Plaque is a  substance that sticks to the hard, outer surface of a tooth (enamel). The bacteria in plaque produce acids that attack enamel. These acids may also attack the root surface of a tooth (cementum) if it is exposed. Repeated attacks dissolve these surfaces and create holes in the tooth (cavities). If left untreated, the acids destroy the other layers of the tooth.  °RISK FACTORS °· Frequent sipping of sugary beverages.   °· Frequent snacking on sugary and starchy foods, especially those that easily get stuck in the teeth.   °· Poor oral hygiene.   °· Dry mouth.   °· Substance abuse such as methamphetamine abuse.   °· Broken or poor-fitting dental restorations.   °· Eating disorders.   °· Gastroesophageal reflux disease (GERD).   °· Certain radiation treatments to the head and neck. °SYMPTOMS °In the early stages of dental caries, symptoms are seldom present. Sometimes white, chalky areas may be seen on the enamel or other tooth layers. In later stages, symptoms may include: °· Pits and holes on the enamel. °· Toothache after sweet, hot, or cold foods or drinks are consumed. °· Pain around the tooth. °· Swelling around the tooth. °DIAGNOSIS  °Most of the time, dental caries is detected during a regular dental checkup. A diagnosis is made after a thorough medical and dental history is taken and the surfaces of your teeth are checked for signs of dental caries. Sometimes special instruments, such as lasers, are used to check for dental caries. Dental X-ray exams may be taken so that areas not visible to the eye (such as between the contact areas of the teeth) can be checked for cavities.  °TREATMENT  °If dental caries is in its early stages, it may be reversed with a fluoride treatment or an application of a remineralizing agent at the dental office. Thorough brushing and flossing at home is needed to aid these treatments. If it is in its later stages, treatment depends on the location and extent of tooth destruction:   °· If a small area of the tooth has been destroyed, the destroyed area will be removed and cavities will be filled with a material such as gold, silver amalgam, or composite resin.   °· If a large area of the tooth has been destroyed, the destroyed area will be removed and a cap (crown) will be fitted over the remaining tooth structure.   °· If the center part of the tooth (pulp) is affected, a procedure called a root canal will be needed before a filling or crown can be placed.   °· If most of the tooth has been destroyed, the tooth may need to be pulled (extracted). °HOME CARE INSTRUCTIONS °You can prevent, stop, or reverse dental caries at home by practicing good oral hygiene. Good oral hygiene includes: °· Thoroughly cleaning your teeth at least twice a day with a toothbrush and dental floss.   °· Using a fluoride toothpaste. A fluoride mouth rinse may also be used if recommended by your dentist or health care provider.   °· Restricting the amount of sugary and starchy foods and sugary liquids you consume.   °· Avoiding frequent snacking on these foods and sipping of these liquids.   °· Keeping regular visits with a dentist for checkups and cleanings. °PREVENTION  °·   Practice good oral hygiene. °· Consider a dental sealant. A dental sealant is a coating material that is applied by your dentist to the pits and grooves of teeth. The sealant prevents food from being trapped in them. It may protect the teeth for several years. °· Ask about fluoride supplements if you live in a community without fluorinated water or with water that has a low fluoride content. Use fluoride supplements as directed by your dentist or health care provider. °· Allow fluoride varnish applications to teeth if directed by your dentist or health care provider. °Document Released: 11/29/2001 Document Revised: 07/24/2013 Document Reviewed: 03/11/2012 °ExitCare® Patient Information ©2015 [employer]. This information is not intended to  replace advice given to you by your health care provider. Make sure you discuss any questions you have with your health care provider. ° ° ° °Emergency Department Resource Guide °1) Find a Doctor and Pay Out of Pocket °Although you won't have to find out who is covered by your insurance plan, it is a good idea to ask around and get recommendations. You will then need to call the office and see if the doctor you have chosen will accept you as a new patient and what types of options they offer for patients who are self-pay. Some doctors offer discounts or will set up payment plans for their patients who do not have insurance, but you will need to ask so you aren't surprised when you get to your appointment. ° °2) Contact Your Local Health Department °Not all health departments have doctors that can see patients for sick visits, but many do, so it is worth a call to see if yours does. If you don't know where your local health department is, you can check in your phone book. The CDC also has a tool to help you locate your state's health department, and many state websites also have listings of all of their local health departments. ° °3) Find a Walk-in Clinic °If your illness is not likely to be very severe or complicated, you may want to try a walk in clinic. These are popping up all over the country in pharmacies, drugstores, and shopping centers. They're usually staffed by nurse practitioners or physician assistants that have been trained to treat common illnesses and complaints. They're usually fairly quick and inexpensive. However, if you have serious medical issues or chronic medical problems, these are probably not your best option. ° °No Primary Care Doctor: °- Call [facility] at  832-8000 - they can help you locate a primary care doctor that  accepts your insurance, provides certain services, etc. °- Physician Referral Service- 1-800-533-3463 ° °Chronic Pain Problems: °Organization         Address  Phone    Notes  °[facility]  (336) 297-2271 Patients need to be referred by their primary care doctor.  ° °Medication Assistance: °Organization         Address  Phone   Notes  °[place] [facility] [address] °[place], [place] [address] (336) 641-8030 --Must be a resident of [place] °-- Must have NO insurance coverage whatsoever (no Medicaid/ Medicare, etc.) °-- The pt. MUST have a primary care doctor that directs their care regularly and follows them in the community °  °[facility]  (866) 331-1348   °[employer]  (888) 892-1162   ° °Agencies that provide inexpensive medical care: °Organization         Address  Phone     Notes  °[facility]  (336) 832-8035   °[facility]    (336) 832-7272   °[facility] [address] °[place], [place] [address] (336) 832-4777   °[facility] [address], °[place] (336) 271-4999   °[facility]    (336) 373-0678   °[facility]    (336) 272-1050   °[facility] ° [address], [place] Phone:  (336) 832-4444, Fax:  (336) 832-4440 Hours of Operation:  9 am - 6 pm, M-F.  Also accepts Medicaid/Medicare and self-pay.  °[facility] ° [address], [place] Phone: (336) 832-3150, Fax: (336) 832-3151. Hours of Operation:  8:30 am - 5:30 pm, M-F.  Also accepts Medicaid and self-pay.  °[facility] [place] [address], [place] Phone: (336) 878-6027   °[facility] [address], [place], [place] (336)723-1848, Ext. 123 Mondays & Thursdays: 7-9 AM.  First 15 patients are seen on a first come, first serve basis. °  ° °Medicaid-accepting [place] Providers: ° °Organization         Address  Phone   Notes  °[facility] [address], [place] (336) 641-2100 Also accepts self-pay patients.  °[facility]  [address], [place] ° (336) 856-9996   °[facility] [address], [place] (336) 288-8857   °[facility] [address] [place] [address], [place] (336) 299-7000   °Veita Bland [address], [place]  ° (336) 373-1557 Only accepts [place] Access Medicaid patients after they have their name applied to their card.  ° °Self-Pay (no insurance) in [place]: ° °Organization         Address  Phone   Notes  °Sickle Cell Patients, [facility] [address], [place] (336) 832-1970   °[facility] [address], [place] (336) 832-4400   °[facility] ° [address], [place] (336) 992-4800   °[facility]/Dr. Osei-Bonsu ° [address] [place] [address], [place] or [address], [place] (336) 841-8500 Phone number for both [place] and [place] locations is the same.  °[facility] [address], [place] (336) 299-0000   °[facility] [address] [place] [address], [place] or [address] (336) 852-7530 °(336) 878-2260   °[facility] [address], [place] (336) 350-1642, phone; (336) 294-5005, fax Sees patients 1st and 3rd Saturday of every month.  Must not qualify for public or private insurance (i.e. Medicaid, Medicare, [place] Health Choice, Veterans' Benefits) • Household income should be no more than 200% of the poverty level •The clinic cannot treat you if you are pregnant or think you are pregnant • Sexually transmitted diseases are not treated at the clinic.  ° ° °Dental Care: °Organization         Address  Phone  Notes  °[place] [facility] [address], [place] (336) 641-6152 Accepts children up to age 21 who are enrolled in Medicaid or [place] Health Choice; pregnant women with a Medicaid card; and children who have  applied for Medicaid or [place] Health Choice, but were declined, whose parents can pay a reduced fee at time of service.  °[place]   [facility] [place]  [address], [place] (336) 641-7733 Accepts children up to age 21 who are enrolled in Medicaid or [place] Health Choice; pregnant women with a Medicaid card; and children who have applied for Medicaid or [place] Health Choice, but were declined, whose parents can pay a reduced fee at time of service.  °[facility] ° [address], [place] (336) 641-4533 Patients are seen by appointment only. Walk-ins are not accepted. [facility] will see patients 18 years of age and older. °Monday - Tuesday (8am-5pm) °Most Wednesdays (8:30-5pm) °$30 per visit, cash only  °[facility] ° [address], [place] (336) 641-4533 Patients are seen by appointment only. Walk-ins are not accepted. [facility] will see patients 18 years of age and older. °One Wednesday Evening (Monthly: Volunteer Based).  $30 per visit, cash only  °[facility]  (919) 537-3737 for adults; Children under age 4, call Graduate Pediatric Dentistry at ([phone]) [phone]. Children aged 4-14, please call (919) [phone] to request a pediatric application. ° Dental services are provided in all areas of dental care including fillings, crowns and bridges, complete and partial dentures, implants, gum treatment, root canals, and extractions. Preventive care is also provided. Treatment is provided to both adults and children. °Patients are selected via a lottery and there is often a waiting list. °  °[facility] [address], °[place] ° (336) 763-8833 [email or web address] °  °[facility] [address], [place], [place] (336)723-1848, Ext. 123 Second and Fourth Thursday of each month, opens at 6:30 AM; Clinic ends at 9 AM.  Patients are seen on a first-come first-served basis, and a  limited number are seen during each clinic.  ° °[facility] ° [address], [place], [place] (336) 723-7904   Eligibility Requirements °You must have lived in [place], [place], or [place] counties for at least the last three months. °  You cannot be eligible for state or federal sponsored healthcare insurance, including Veterans Administration, Medicaid, or Medicare. °  You generally cannot be eligible for healthcare insurance through your employer.  °  How to apply: °Eligibility screenings are held every Tuesday and Wednesday afternoon from 1:00 pm until 4:00 pm. You do not need an appointment for the interview!  °[facility] [address], [place], [place] 336-631-2330   °[facility]  336-342-8273   °[facility]  336-703-3100   °[facility]  336-570-6415   ° °Behavioral Health Resources in the Community: °Intensive Outpatient Programs °Organization         Address  Phone  Notes  °[facility] [address], [place], [place] 336-878-6098   °[facility] [address], [place], [place] 336-832-9800   °[facility] [address], [place], [place] ° 336-882-2125   °[facility] [address],  °[place],  1-800-853-5163 or 336-641-4981   °Substance Abuse Resources °Organization         Address  Phone  Notes  °[facility]  336-882-2125   °[facility]  336-784-9470   °[facility]  336-285-9073   °[facility]  336-845-3988   °Residential & Outpatient Substance Abuse Program  1-800-659-3381   °Psychological Services °Organization         Address  Phone  Notes  °[facility]  336- 832-9600   °[facility]  336- 378-7881   °[facility] [address], [place] 1-800-853-5163 or 336-641-4981   ° °Mobile Crisis Teams °Organization          Address  Phone  Notes  °[facility], Mobile Crisis Care Unit  1-877-626-1772   °Assertive °[facility] ° [address] [place], [place] 336-834-9664   °Sharon DeEsch [address] °[place] 336-554-5454   ° °Self-Help/Support Groups °Organization         Address  Phone             Notes  °[facility] - variety of support groups  336- 373-1402 Call for more information  °Narcotics Anonymous (NA), [facility] [address], °[place]  2 meetings at this location  ° °Residential Treatment Programs °Organization         Address  Phone  Notes  °[facility] [address],    °[place]  1-866-801-8205   °[facility] ° [address], [place], [place] 704-293-8524   °[facility] [address], [place] 336-845-3988 Admissions: 8am-3pm M-F  °[facility] [address]    °[place], [place] 336-841-1104   °[facility] [address], [place], [place] 336-379-7146   °[facility] [address]  °[place], [place] 336-285-9073   °[facility] - Intensive Outpatient [address], [place], [place] 336-852-3033   °[facility] [address]  °[place], [place] 1-877-615-2722 or 336-784-9470   °[facility] [address] , [place] 336-227-7417 Accepts Medicaid  °[facility] [address]  °[place] 1-800-659-3381 Substance Abuse/Addiction Treatment  ° °[place] Behavioral Health Resources °Organization         Address  Phone  Notes  °[facility]  (888) 581-9988   °Julie Brannon, PhD [address] [place], [place]   (336) 349-5553 or (336) 951-0000   °[facility]   [address] °[place], [place] (336) 349-4454   °[facility] [address], [place], [place] (336) 342-8316 Insurance/Medicaid/sponsorship  through [facility]  °[facility] [address]                                    [place], [place] (336) 342-8316 Therapy/tele-psych/case  °[facility] [address]  ° [place], [place] (336) 349-2233    °Dr. Arfeen  (336) 349-4544   °[facility] [place]  [employer] [place] [facility] 1) [address], [place] °2) [address], [place] °3)  [address], [place] (336) 349-3220 °(336) 342-7768 ° °(336) 342-8140   °[place] [facility] (336) 342-1394 or (336) 342-3537 (After Hours)    ° ° ° °

## 2018-05-02 ENCOUNTER — Emergency Department (HOSPITAL_BASED_OUTPATIENT_CLINIC_OR_DEPARTMENT_OTHER)
Admission: EM | Admit: 2018-05-02 | Discharge: 2018-05-02 | Disposition: A | Discharge status: Home / Self Care | Payer: No Typology Code available for payment source | Attending: Emergency Medicine | Admitting: Emergency Medicine

## 2018-05-02 ENCOUNTER — Encounter (HOSPITAL_BASED_OUTPATIENT_CLINIC_OR_DEPARTMENT_OTHER): Payer: Self-pay | Admitting: *Deleted

## 2018-05-02 ENCOUNTER — Other Ambulatory Visit: Payer: Self-pay

## 2018-05-02 DIAGNOSIS — K0889 Other specified disorders of teeth and supporting structures: Secondary | ICD-10-CM | POA: Insufficient documentation

## 2018-05-02 MED ORDER — PENICILLIN V POTASSIUM 500 MG PO TABS: 500.0000 mg | ORAL_TABLET | Freq: Three times a day (TID) | ORAL | 0 refills | Status: DC

## 2018-05-02 MED ORDER — HYDROCODONE-ACETAMINOPHEN 5-325 MG PO TABS: 1.0000 | ORAL_TABLET | Freq: Four times a day (QID) | ORAL | 0 refills | Status: DC | PRN

## 2018-05-02 NOTE — Discharge Instructions (Addendum)
Penicillin as prescribed.  Hydrocodone as prescribed as needed for pain. ° °Follow-up with dentistry in the next 2 to 3 days. °

## 2018-05-02 NOTE — ED Provider Notes (Signed)
[facility] EMERGENCY DEPARTMENT Provider Note   CSN: [id number] Arrival date & time: 05/02/18  0010     History   Chief Complaint Chief Complaint  Patient presents with  . dental pain    HPI Mackenzie Giles is a 38 y.o. female.  Patient is a 38 year old female presenting with complaints of right lower jaw pain and facial swelling.  This began several days ago and is worsening.  She believes she has an infected tooth.  She denies fevers or chills.  She denies any difficulty swallowing or breathing.  The history is provided by the patient.    History reviewed. No pertinent past medical history.  There are no active problems to display for this patient.   History reviewed. No pertinent surgical history.   OB History   No obstetric history on file.      Home Medications    Prior to Admission medications   Medication Sig Start Date End Date Taking? Authorizing Provider  ibuprofen (ADVIL,MOTRIN) 800 MG tablet Take 1 tablet (800 mg total) by mouth every 8 (eight) hours as needed for mild pain. 06/06/14   Ward, Layla Maw, DO  promethazine (PHENERGAN) 25 MG tablet Take 1 tablet (25 mg total) by mouth every 6 (six) hours as needed for nausea or vomiting. 06/06/14   Ward, Layla Maw, DO  traMADol (ULTRAM) 50 MG tablet Take 1 tablet (50 mg total) by mouth every 6 (six) hours as needed. 06/06/14   Ward, Layla Maw, DO    Family History Family History  Problem Relation Age of Onset  . Cancer Mother        breast cancer    Social History Social History   Tobacco Use  . Smoking status: Never Smoker  . Smokeless tobacco: Never Used  Substance Use Topics  . Alcohol use: No  . Drug use: No     Allergies   Patient has no known allergies.   Review of Systems Review of Systems  All other systems reviewed and are negative.    Physical Exam Updated Vital Signs BP (!) 145/78   Pulse 66   Temp 98.2 F (36.8 C)   Resp 18   Ht 5\' 2"  (1.575 m)   Wt 64.4  kg   LMP 04/25/2018 (Exact Date)   SpO2 100%   BMI 25.97 kg/m   Physical Exam Vitals signs and nursing note reviewed.  Constitutional:      General: She is not in acute distress.    Appearance: Normal appearance. She is not ill-appearing.  HENT:     Head: Normocephalic and atraumatic.     Mouth/Throat:     Mouth: Mucous membranes are moist.     Pharynx: No oropharyngeal exudate.     Comments: Patient is missing several molars from the right lower jaw.  The remaining bicuspid appears heavily decayed.  There is some surrounding gingival inflammation, however no abscess is visible.  There is some swelling to the left lower mandible. Pulmonary:     Effort: Pulmonary effort is normal.  Neurological:     Mental Status: She is alert.      ED Treatments / Results  Labs (all labs ordered are listed, but only abnormal results are displayed) Labs Reviewed - No data to display  EKG None  Radiology No results found.  Procedures Procedures (including critical care time)  Medications Ordered in ED Medications - No data to display   Initial Impression / Assessment and Plan / ED Course  I have reviewed the triage vital signs and the nursing notes.  Pertinent labs & imaging results that were available during my care of the patient were reviewed by me and considered in my medical decision making (see chart for details).  Patient will be given antibiotics and pain medication.  She is to follow-up with dentistry.  Final Clinical Impressions(s) / ED Diagnoses   Final diagnoses:  None    ED Discharge Orders    None       Geoffery Lyons, MD 05/02/18 [phone]

## 2018-05-02 NOTE — ED Triage Notes (Signed)
Pt c/o right lower tooth pain that started yesterday. States tooth is chipped. Has not had any fevers. Has had an issue with this tooth before. Does not have a dentist. Has taken ibuprofen 3pm without relief.

## 2019-09-09 ENCOUNTER — Other Ambulatory Visit: Payer: Self-pay

## 2019-09-09 ENCOUNTER — Encounter (HOSPITAL_COMMUNITY): Payer: Self-pay

## 2019-09-09 ENCOUNTER — Ambulatory Visit (HOSPITAL_COMMUNITY)
Admission: EM | Admit: 2019-09-09 | Discharge: 2019-09-09 | Disposition: A | Discharge status: Home / Self Care | Payer: Medicaid Other | Attending: Family Medicine | Admitting: Family Medicine

## 2019-09-09 DIAGNOSIS — K0889 Other specified disorders of teeth and supporting structures: Secondary | ICD-10-CM

## 2019-09-09 MED ORDER — HYDROCODONE-ACETAMINOPHEN 5-325 MG PO TABS: 1.0000 | ORAL_TABLET | Freq: Three times a day (TID) | ORAL | 0 refills | Status: AC | PRN

## 2019-09-09 MED ORDER — IBUPROFEN 600 MG PO TABS: 600.0000 mg | ORAL_TABLET | Freq: Four times a day (QID) | ORAL | 0 refills | Status: AC | PRN

## 2019-09-09 MED ORDER — AMOXICILLIN-POT CLAVULANATE 875-125 MG PO TABS: 1.0000 | ORAL_TABLET | Freq: Two times a day (BID) | ORAL | 0 refills | Status: AC

## 2019-09-09 NOTE — ED Provider Notes (Signed)
[facility]    CSN: [id number] Arrival date & time: 09/09/19  1643      History   Chief Complaint Chief Complaint  Patient presents with   Dental Pain    HPI Mackenzie Giles is a 39 y.o. female.   She is presenting with left-sided lower dental pain.  This pain is been ongoing for a couple of days.  She has had a reoccurrence of this area.  She has had previous tooth extractions.  No fevers.  No chills.  HPI  History reviewed. No pertinent past medical history.  There are no problems to display for this patient.   History reviewed. No pertinent surgical history.  OB History   No obstetric history on file.      Home Medications    Prior to Admission medications   Medication Sig Start Date End Date Taking? Authorizing Provider  amoxicillin-clavulanate (AUGMENTIN) 875-125 MG tablet Take 1 tablet by mouth every 12 (twelve) hours. 09/09/19   Mackenzie Rude, MD  HYDROcodone-acetaminophen (NORCO/VICODIN) 5-325 MG tablet Take 1 tablet by mouth every 8 (eight) hours as needed. 09/09/19   Mackenzie Rude, MD  ibuprofen (ADVIL) 600 MG tablet Take 1 tablet (600 mg total) by mouth every 6 (six) hours as needed. 09/09/19   Mackenzie Rude, MD  promethazine (PHENERGAN) 25 MG tablet Take 1 tablet (25 mg total) by mouth every 6 (six) hours as needed for nausea or vomiting. 06/06/14 09/09/19  Ward, Layla Maw, DO    Family History Family History  Problem Relation Age of Onset   Cancer Mother        breast cancer    Social History Social History   Tobacco Use   Smoking status: Never Smoker   Smokeless tobacco: Never Used  Vaping Use   Vaping Use: Never used  Substance Use Topics   Alcohol use: No   Drug use: No     Allergies   Patient has no known allergies.   Review of Systems Review of Systems  See HPI  Physical Exam Triage Vital Signs ED Triage Vitals  Enc Vitals Group     BP 09/09/19 1703 130/70     Pulse Rate 09/09/19 1703 66      Resp 09/09/19 1703 18     Temp 09/09/19 1703 99.3 F (37.4 C)     Temp Source 09/09/19 1703 Oral     SpO2 09/09/19 1703 100 %     Weight --      Height --      Head Circumference --      Peak Flow --      Pain Score 09/09/19 1701 7     Pain Loc --      Pain Edu? --      Excl. in GC? --    No data found.  Updated Vital Signs BP 130/70 (BP Location: Left Arm)    Pulse 66    Temp 99.3 F (37.4 C) (Oral)    Resp 18    SpO2 100%   Visual Acuity Right Eye Distance:   Left Eye Distance:   Bilateral Distance:    Right Eye Near:   Left Eye Near:    Bilateral Near:     Physical Exam Gen: NAD, alert, cooperative with exam, well-appearing ENT: normal lips, normal nasal mucosa, tender tooth pain to left lower Eye: normal EOM, normal conjunctiva and lids Skin: no rashes, no areas of induration     UC Treatments /  Results  Labs (all labs ordered are listed, but only abnormal results are displayed) Labs Reviewed - No data to display  EKG   Radiology No results found.  Procedures Procedures (including critical care time)  Medications Ordered in UC Medications - No data to display  Initial Impression / Assessment and Plan / UC Course  I have reviewed the triage vital signs and the nursing notes.  Pertinent labs & imaging results that were available during my care of the patient were reviewed by me and considered in my medical decision making (see chart for details).     Mackenzie Giles is a 39 year old female is presenting with dental pain.  Likely needs tooth extraction.  Will provide ibuprofen, Augmentin and Norco as needed for severe pain.  Counseled supportive care.  Given indications to see dentist fairly soon.  Given indications follow-up return.  Final Clinical Impressions(s) / UC Diagnoses   Final diagnoses:  Pain, dental     Discharge Instructions     Please call [facility] [phone] Please take the ibuprofen on regular basis  Please use the pain  medicine for severe pain.  Please follow up if your symptoms fail to improve.     ED Prescriptions    Medication Sig Dispense Auth. Provider   amoxicillin-clavulanate (AUGMENTIN) 875-125 MG tablet Take 1 tablet by mouth every 12 (twelve) hours. 14 tablet Mackenzie Ax, MD   ibuprofen (ADVIL) 600 MG tablet Take 1 tablet (600 mg total) by mouth every 6 (six) hours as needed. 30 tablet Mackenzie Ax, MD   HYDROcodone-acetaminophen (NORCO/VICODIN) 5-325 MG tablet Take 1 tablet by mouth every 8 (eight) hours as needed. 15 tablet Mackenzie Ax, MD     I have reviewed the PDMP during this encounter.   Mackenzie Ax, MD 09/09/19 [phone]

## 2019-09-09 NOTE — Discharge Instructions (Signed)
Please call [facility] [phone] Please take the ibuprofen on regular basis  Please use the pain medicine for severe pain.  Please follow up if your symptoms fail to improve.

## 2019-09-09 NOTE — ED Triage Notes (Signed)
Pt c/o dental pain lower left for several days; states she saw " a white pimple on gum line" Thursday, that has since resolved. Also c/o tenderness under left mandible.  Denies fever, chills, n/v, or foul taste/drainage in mouth.  Left mandibular/cervical lymph nodes enlarged. Last dose ibuprofen 400 mg at approx 1400 today.

## 2023-05-25 ENCOUNTER — Encounter: Payer: Self-pay | Admitting: Emergency Medicine

## 2023-05-25 ENCOUNTER — Ambulatory Visit
Admission: EM | Admit: 2023-05-25 | Discharge: 2023-05-25 | Disposition: A | Discharge status: Home / Self Care | Payer: Medicaid Other

## 2023-05-25 DIAGNOSIS — R509 Fever, unspecified: Secondary | ICD-10-CM | POA: Diagnosis present

## 2023-05-25 DIAGNOSIS — B349 Viral infection, unspecified: Secondary | ICD-10-CM | POA: Diagnosis not present

## 2023-05-25 LAB — POC COVID19/FLU A&B COMBO
Covid Antigen, POC: NEGATIVE
Influenza A Antigen, POC: NEGATIVE
Influenza B Antigen, POC: NEGATIVE

## 2023-05-25 MED ORDER — ACETAMINOPHEN 325 MG PO TABS
975.0000 mg | ORAL_TABLET | Freq: Once | ORAL | Status: AC
  Administered 2023-05-25: 975 mg via ORAL

## 2023-05-25 NOTE — Discharge Instructions (Signed)
 Rapid flu and rapid COVID-negative.  COVID test pending.  Suspect that you have a viral illness that should run its course.  Ensure adequate fluids and rest.  Follow-up if any symptoms persist or worsen.

## 2023-05-25 NOTE — ED Triage Notes (Signed)
 Pt presents with a cough, headache, chills, bodayches and nausea since yesterday. Pt has taken cough medication for her symptoms.

## 2023-05-25 NOTE — ED Provider Notes (Signed)
 [facility]    CSN: [id number] Arrival date & time: 05/25/23  1708      History   Chief Complaint Chief Complaint  Patient presents with   Cough   Chills   Generalized Body Aches    HPI CHALA GUL is a 43 y.o. female.   Patient presents with cough, headache, chills, body aches, nasal congestion that started yesterday.  Denies sore throat.  Tmax at home was 100.2.  She denies that she has taken any medications for symptoms.  Denies any known sick contacts.  Denies history of asthma or COPD.   Cough   History reviewed. No pertinent past medical history.  There are no active problems to display for this patient.   History reviewed. No pertinent surgical history.  OB History   No obstetric history on file.      Home Medications    Prior to Admission medications   Medication Sig Start Date End Date Taking? Authorizing Provider  Etonogestrel (NEXPLANON [place]) Inject into the skin.   Yes [provider]  amoxicillin-clavulanate (AUGMENTIN) 875-125 MG tablet Take 1 tablet by mouth every 12 (twelve) hours. 09/09/19   Myra Rude, MD  HYDROcodone-acetaminophen (NORCO/VICODIN) 5-325 MG tablet Take 1 tablet by mouth every 8 (eight) hours as needed. 09/09/19   Myra Rude, MD  ibuprofen (ADVIL) 600 MG tablet Take 1 tablet (600 mg total) by mouth every 6 (six) hours as needed. 09/09/19   Myra Rude, MD  promethazine (PHENERGAN) 25 MG tablet Take 1 tablet (25 mg total) by mouth every 6 (six) hours as needed for nausea or vomiting. 06/06/14 09/09/19  Ward, Layla Maw, DO    Family History Family History  Problem Relation Age of Onset   Cancer Mother        breast cancer    Social History Social History   Tobacco Use   Smoking status: Never   Smokeless tobacco: Never  Vaping Use   Vaping status: Never Used  Substance Use Topics   Alcohol use: No   Drug use: No     Allergies   Patient has no known allergies.   Review of  Systems Review of Systems Per HPI  Physical Exam Triage Vital Signs ED Triage Vitals  Encounter Vitals Group     BP 05/25/23 1759 (!) 147/87     Systolic BP Percentile --      Diastolic BP Percentile --      Pulse Rate 05/25/23 1759 (!) 108     Resp 05/25/23 1759 18     Temp 05/25/23 1759 (!) 102.5 F (39.2 C)     Temp Source 05/25/23 1759 Oral     SpO2 05/25/23 1759 95 %     Weight --      Height --      Head Circumference --      Peak Flow --      Pain Score 05/25/23 1758 5     Pain Loc --      Pain Education --      Exclude from Growth Chart --    No data found.  Updated Vital Signs BP (!) 147/87 (BP Location: Right Arm)   Pulse (!) 108   Temp (!) 100.6 F (38.1 C) (Oral)   Resp 18   SpO2 95%   Visual Acuity Right Eye Distance:   Left Eye Distance:   Bilateral Distance:    Right Eye Near:   Left Eye Near:  Bilateral Near:     Physical Exam Constitutional:      General: She is not in acute distress.    Appearance: Normal appearance. She is not toxic-appearing or diaphoretic.  HENT:     Head: Normocephalic and atraumatic.     Right Ear: Tympanic membrane and ear canal normal.     Left Ear: Tympanic membrane and ear canal normal.     Nose: Congestion present.     Mouth/Throat:     Mouth: Mucous membranes are moist.     Pharynx: No posterior oropharyngeal erythema.  Eyes:     Extraocular Movements: Extraocular movements intact.     Conjunctiva/sclera: Conjunctivae normal.     Pupils: Pupils are equal, round, and reactive to light.  Cardiovascular:     Rate and Rhythm: Normal rate and regular rhythm.     Pulses: Normal pulses.     Heart sounds: Normal heart sounds.  Pulmonary:     Effort: Pulmonary effort is normal. No respiratory distress.     Breath sounds: Normal breath sounds. No stridor. No wheezing, rhonchi or rales.  Abdominal:     General: Abdomen is flat. Bowel sounds are normal.     Palpations: Abdomen is soft.  Musculoskeletal:         General: Normal range of motion.     Cervical back: Normal range of motion.  Skin:    General: Skin is warm and dry.  Neurological:     General: No focal deficit present.     Mental Status: She is alert and oriented to person, place, and time. Mental status is at baseline.  Psychiatric:        Mood and Affect: Mood normal.        Behavior: Behavior normal.      UC Treatments / Results  Labs (all labs ordered are listed, but only abnormal results are displayed) Labs Reviewed  POC COVID19/FLU A&B COMBO - Normal  SARS CORONAVIRUS 2 (TAT 6-24 HRS)    EKG   Radiology No results found.  Procedures Procedures (including critical care time)  Medications Ordered in UC Medications  acetaminophen (TYLENOL) tablet 975 mg (975 mg Oral Given 05/25/23 1805)    Initial Impression / Assessment and Plan / UC Course  I have reviewed the triage vital signs and the nursing notes.  Pertinent labs & imaging results that were available during my care of the patient were reviewed by me and considered in my medical decision making (see chart for details).     Patient presents with symptoms likely from a viral upper respiratory infection. Do not suspect underlying cardiopulmonary process. Symptoms seem unlikely related to ACS, CHF or COPD exacerbations, pneumonia, pneumothorax. Patient is nontoxic appearing and not in need of emergent medical intervention.  COVID and flu negative in urgent care today.  COVID PCR pending.  Temp improved with Tylenol today.  Recommended symptom control with over the counter medications, fluids, rest.  Discussed fever monitoring and management.  Return if symptoms fail to improve in 1-2 weeks or you develop shortness of breath, chest pain, severe headache. Patient states understanding and is agreeable.  Discharged with PCP followup.  Final Clinical Impressions(s) / UC Diagnoses   Final diagnoses:  Viral illness  Fever, unspecified     Discharge Instructions       Rapid flu and rapid COVID-negative.  COVID test pending.  Suspect that you have a viral illness that should run its course.  Ensure adequate fluids and rest.  Follow-up  if any symptoms persist or worsen.    ED Prescriptions   None    PDMP not reviewed this encounter.   Gustavus Bryant, [place] 05/25/23 [phone]

## 2023-05-26 LAB — SARS CORONAVIRUS 2 (TAT 6-24 HRS): SARS Coronavirus 2: NEGATIVE
